# Patient Record
Sex: Male | Born: 1985 | Race: White | Hispanic: No | Marital: Single | Smoking: Current every day smoker
Health system: Southern US, Community
[De-identification: ages and names within clinical notes are randomized; demographics above are authoritative.]

## PROBLEM LIST (undated history)

## (undated) HISTORY — PX: HAND SURGERY: SHX662

## (undated) HISTORY — PX: OTHER SURGICAL HISTORY: SHX169

---
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
## 2012-06-21 ENCOUNTER — Encounter (HOSPITAL_COMMUNITY): Payer: Self-pay | Admitting: *Deleted

## 2012-06-21 ENCOUNTER — Inpatient Hospital Stay (HOSPITAL_COMMUNITY)
Admission: EM | Admit: 2012-06-21 | Discharge: 2012-06-24 | DRG: 908 | Disposition: A | Discharge status: Home / Self Care | Payer: MEDICAID | Attending: General Surgery | Admitting: General Surgery

## 2012-06-21 ENCOUNTER — Encounter (HOSPITAL_COMMUNITY): Payer: Self-pay | Admitting: Anesthesiology

## 2012-06-21 ENCOUNTER — Encounter (HOSPITAL_COMMUNITY): Admission: EM | Disposition: A | Discharge status: Home / Self Care | Payer: Self-pay | Source: Home / Self Care

## 2012-06-21 ENCOUNTER — Emergency Department (HOSPITAL_COMMUNITY): Payer: Self-pay

## 2012-06-21 ENCOUNTER — Emergency Department (HOSPITAL_COMMUNITY): Payer: Self-pay | Admitting: Anesthesiology

## 2012-06-21 DIAGNOSIS — S21109A Unspecified open wound of unspecified front wall of thorax without penetration into thoracic cavity, initial encounter: Secondary | ICD-10-CM

## 2012-06-21 DIAGNOSIS — F17200 Nicotine dependence, unspecified, uncomplicated: Secondary | ICD-10-CM | POA: Diagnosis present

## 2012-06-21 DIAGNOSIS — S4991XA Unspecified injury of right shoulder and upper arm, initial encounter: Secondary | ICD-10-CM | POA: Diagnosis present

## 2012-06-21 DIAGNOSIS — S41109A Unspecified open wound of unspecified upper arm, initial encounter: Secondary | ICD-10-CM

## 2012-06-21 DIAGNOSIS — W3400XA Accidental discharge from unspecified firearms or gun, initial encounter: Secondary | ICD-10-CM

## 2012-06-21 DIAGNOSIS — Z1810 Retained metal fragments, unspecified: Secondary | ICD-10-CM

## 2012-06-21 DIAGNOSIS — F411 Generalized anxiety disorder: Secondary | ICD-10-CM | POA: Diagnosis present

## 2012-06-21 DIAGNOSIS — F319 Bipolar disorder, unspecified: Secondary | ICD-10-CM | POA: Diagnosis present

## 2012-06-21 DIAGNOSIS — Z658 Other specified problems related to psychosocial circumstances: Secondary | ICD-10-CM

## 2012-06-21 DIAGNOSIS — Y230XXA Shotgun discharge, undetermined intent, initial encounter: Secondary | ICD-10-CM

## 2012-06-21 DIAGNOSIS — S41009A Unspecified open wound of unspecified shoulder, initial encounter: Principal | ICD-10-CM | POA: Diagnosis present

## 2012-06-21 DIAGNOSIS — D62 Acute posthemorrhagic anemia: Secondary | ICD-10-CM | POA: Diagnosis not present

## 2012-06-21 HISTORY — PX: I&D EXTREMITY: SHX5045

## 2012-06-21 LAB — COMPREHENSIVE METABOLIC PANEL
AST: 32 U/L (ref 0–37)
Albumin: 4.4 g/dL (ref 3.5–5.2)
Alkaline Phosphatase: 61 U/L (ref 39–117)
Chloride: 100 mEq/L (ref 96–112)
Creatinine, Ser: 1.18 mg/dL (ref 0.50–1.35)
Potassium: 3.4 mEq/L — ABNORMAL LOW (ref 3.5–5.1)
Total Bilirubin: 0.2 mg/dL — ABNORMAL LOW (ref 0.3–1.2)
Total Protein: 7.1 g/dL (ref 6.0–8.3)

## 2012-06-21 LAB — CBC
HCT: 42.5 % (ref 39.0–52.0)
MCHC: 35.1 g/dL (ref 30.0–36.0)
RBC: 4.68 MIL/uL (ref 4.22–5.81)
RDW: 12 % (ref 11.5–15.5)

## 2012-06-21 LAB — POCT I-STAT, CHEM 8
Calcium, Ion: 1.11 mmol/L — ABNORMAL LOW (ref 1.12–1.23)
HCT: 47 % (ref 39.0–52.0)
TCO2: 25 mmol/L (ref 0–100)

## 2012-06-21 LAB — TYPE AND SCREEN: Unit division: 0

## 2012-06-21 LAB — PROTIME-INR
INR: 1.05 (ref 0.00–1.49)
Prothrombin Time: 13.6 seconds (ref 11.6–15.2)

## 2012-06-21 SURGERY — IRRIGATION AND DEBRIDEMENT EXTREMITY
Anesthesia: General | Site: Arm Upper | Laterality: Right | Wound class: Contaminated

## 2012-06-21 MED ORDER — SODIUM CHLORIDE 0.9 % IV SOLN
INTRAVENOUS | Status: DC | PRN
  Administered 2012-06-21 (×2): via INTRAVENOUS

## 2012-06-21 MED ORDER — DEXAMETHASONE SODIUM PHOSPHATE 4 MG/ML IJ SOLN
INTRAMUSCULAR | Status: DC | PRN
  Administered 2012-06-21: 8 mg via INTRAVENOUS

## 2012-06-21 MED ORDER — FENTANYL CITRATE 0.05 MG/ML IJ SOLN
INTRAMUSCULAR | Status: DC | PRN
  Administered 2012-06-21 (×2): 50 ug via INTRAVENOUS
  Administered 2012-06-21: 150 ug via INTRAVENOUS

## 2012-06-21 MED ORDER — SODIUM CHLORIDE 0.9 % IR SOLN
Status: DC | PRN
  Administered 2012-06-21: 3000 mL
  Administered 2012-06-21: 1000 mL

## 2012-06-21 MED ORDER — TETANUS-DIPHTH-ACELL PERTUSSIS 5-2.5-18.5 LF-MCG/0.5 IM SUSP
0.5000 mL | Freq: Once | INTRAMUSCULAR | Status: AC
  Administered 2012-06-21: 0.5 mL via INTRAMUSCULAR
  Filled 2012-06-21: qty 0.5

## 2012-06-21 MED ORDER — MORPHINE SULFATE 4 MG/ML IJ SOLN: 4.0000 mg | Freq: Once | INTRAMUSCULAR | Status: DC

## 2012-06-21 MED ORDER — ONDANSETRON HCL 4 MG/2ML IJ SOLN
4.0000 mg | Freq: Once | INTRAMUSCULAR | Status: AC
  Administered 2012-06-21: 4 mg via INTRAVENOUS
  Filled 2012-06-21: qty 2

## 2012-06-21 MED ORDER — MORPHINE SULFATE 2 MG/ML IJ SOLN
INTRAMUSCULAR | Status: AC
  Administered 2012-06-21: 4 mg via INTRAVENOUS
  Filled 2012-06-21: qty 1

## 2012-06-21 MED ORDER — ONDANSETRON HCL 4 MG/2ML IJ SOLN: 4.0000 mg | Freq: Once | INTRAMUSCULAR | Status: DC

## 2012-06-21 MED ORDER — ARTIFICIAL TEARS OP OINT
TOPICAL_OINTMENT | OPHTHALMIC | Status: DC | PRN
  Administered 2012-06-21: 1 via OPHTHALMIC

## 2012-06-21 MED ORDER — MORPHINE SULFATE 2 MG/ML IJ SOLN
INTRAMUSCULAR | Status: AC
  Filled 2012-06-21: qty 2

## 2012-06-21 MED ORDER — PROPOFOL 10 MG/ML IV BOLUS
INTRAVENOUS | Status: DC | PRN
  Administered 2012-06-21: 200 mg via INTRAVENOUS

## 2012-06-21 MED ORDER — SUCCINYLCHOLINE CHLORIDE 20 MG/ML IJ SOLN
INTRAMUSCULAR | Status: DC | PRN
  Administered 2012-06-21: 120 mg via INTRAVENOUS

## 2012-06-21 MED ORDER — CEFAZOLIN SODIUM-DEXTROSE 2-3 GM-% IV SOLR
2.0000 g | Freq: Once | INTRAVENOUS | Status: AC
  Administered 2012-06-21: 2 g via INTRAVENOUS
  Filled 2012-06-21: qty 50

## 2012-06-21 MED ORDER — SODIUM CHLORIDE 0.9 % IR SOLN
Status: DC | PRN
  Administered 2012-06-21: 23:00:00

## 2012-06-21 MED ORDER — LIDOCAINE HCL (CARDIAC) 20 MG/ML IV SOLN
INTRAVENOUS | Status: DC | PRN
  Administered 2012-06-21: 100 mg via INTRAVENOUS

## 2012-06-21 MED ORDER — EPHEDRINE SULFATE 50 MG/ML IJ SOLN
INTRAMUSCULAR | Status: DC | PRN
  Administered 2012-06-21 (×2): 5 mg via INTRAVENOUS

## 2012-06-21 MED ORDER — ONDANSETRON HCL 4 MG/2ML IJ SOLN
INTRAMUSCULAR | Status: AC
  Administered 2012-06-21: 4 mg
  Filled 2012-06-21: qty 2

## 2012-06-21 SURGICAL SUPPLY — 57 items
BAG DECANTER FOR FLEXI CONT (MISCELLANEOUS) IMPLANT
BANDAGE ELASTIC 4 VELCRO ST LF (GAUZE/BANDAGES/DRESSINGS) ×3 IMPLANT
BANDAGE ELASTIC 6 VELCRO ST LF (GAUZE/BANDAGES/DRESSINGS) ×1 IMPLANT
BANDAGE ESMARK 6X9 LF (GAUZE/BANDAGES/DRESSINGS) IMPLANT
BANDAGE GAUZE ELAST BULKY 4 IN (GAUZE/BANDAGES/DRESSINGS) ×4 IMPLANT
BNDG CMPR 9X6 STRL LF SNTH (GAUZE/BANDAGES/DRESSINGS)
BNDG COHESIVE 4X5 TAN STRL (GAUZE/BANDAGES/DRESSINGS) ×2 IMPLANT
BNDG ESMARK 6X9 LF (GAUZE/BANDAGES/DRESSINGS)
CANISTER WOUND CARE 500ML ATS (WOUND CARE) ×1 IMPLANT
CLIP LIGATING EXTRA SM BLUE (MISCELLANEOUS) ×1 IMPLANT
CLOTH BEACON ORANGE TIMEOUT ST (SAFETY) ×2 IMPLANT
CUFF TOURNIQUET SINGLE 18IN (TOURNIQUET CUFF) ×2 IMPLANT
CUFF TOURNIQUET SINGLE 24IN (TOURNIQUET CUFF) IMPLANT
CUFF TOURNIQUET SINGLE 34IN LL (TOURNIQUET CUFF) IMPLANT
CUFF TOURNIQUET SINGLE 44IN (TOURNIQUET CUFF) IMPLANT
DRAPE U-SHAPE 47X51 STRL (DRAPES) ×2 IMPLANT
DRSG ADAPTIC 3X8 NADH LF (GAUZE/BANDAGES/DRESSINGS) ×1 IMPLANT
DRSG EMULSION OIL 3X3 NADH (GAUZE/BANDAGES/DRESSINGS) ×1 IMPLANT
DRSG PAD ABDOMINAL 8X10 ST (GAUZE/BANDAGES/DRESSINGS) ×3 IMPLANT
DURAPREP 26ML APPLICATOR (WOUND CARE) ×1 IMPLANT
ELECT CAUTERY BLADE 6.4 (BLADE) ×1 IMPLANT
ELECT REM PT RETURN 9FT ADLT (ELECTROSURGICAL) ×2
ELECTRODE REM PT RTRN 9FT ADLT (ELECTROSURGICAL) IMPLANT
GAUZE XEROFORM 1X8 LF (GAUZE/BANDAGES/DRESSINGS) ×1 IMPLANT
GLOVE BIO SURGEON STRL SZ8 (GLOVE) ×1 IMPLANT
GLOVE BIOGEL PI IND STRL 8 (GLOVE) ×2 IMPLANT
GLOVE BIOGEL PI INDICATOR 8 (GLOVE) ×3
GLOVE ECLIPSE 7.5 STRL STRAW (GLOVE) ×4 IMPLANT
GOWN PREVENTION PLUS XLARGE (GOWN DISPOSABLE) ×2 IMPLANT
GOWN SRG XL XLNG 56XLVL 4 (GOWN DISPOSABLE) ×1 IMPLANT
GOWN STRL NON-REIN LRG LVL3 (GOWN DISPOSABLE) ×2 IMPLANT
GOWN STRL NON-REIN XL XLG LVL4 (GOWN DISPOSABLE) ×2
GOWN STRL REIN 3XL LVL4 (GOWN DISPOSABLE) ×1 IMPLANT
HANDPIECE INTERPULSE COAX TIP (DISPOSABLE) ×2
KIT BASIN OR (CUSTOM PROCEDURE TRAY) ×2 IMPLANT
KIT ROOM TURNOVER OR (KITS) ×2 IMPLANT
MANIFOLD NEPTUNE II (INSTRUMENTS) ×2 IMPLANT
NS IRRIG 1000ML POUR BTL (IV SOLUTION) ×2 IMPLANT
PACK ORTHO EXTREMITY (CUSTOM PROCEDURE TRAY) ×2 IMPLANT
PAD ARMBOARD 7.5X6 YLW CONV (MISCELLANEOUS) ×4 IMPLANT
PAD CAST 4YDX4 CTTN HI CHSV (CAST SUPPLIES) ×1 IMPLANT
PADDING CAST COTTON 4X4 STRL (CAST SUPPLIES)
SET HNDPC FAN SPRY TIP SCT (DISPOSABLE) IMPLANT
SLING ULTRA II LARGE (SOFTGOODS) ×1 IMPLANT
SPLINT FIBERGLASS 4X30 (CAST SUPPLIES) ×1 IMPLANT
SPONGE GAUZE 4X4 12PLY (GAUZE/BANDAGES/DRESSINGS) ×1 IMPLANT
SPONGE LAP 18X18 X RAY DECT (DISPOSABLE) ×2 IMPLANT
STOCKINETTE IMPERVIOUS 9X36 MD (GAUZE/BANDAGES/DRESSINGS) ×2 IMPLANT
SUT SILK 2 0 (SUTURE) ×2
SUT SILK 2-0 18XBRD TIE 12 (SUTURE) IMPLANT
TOWEL OR 17X24 6PK STRL BLUE (TOWEL DISPOSABLE) ×2 IMPLANT
TOWEL OR 17X26 10 PK STRL BLUE (TOWEL DISPOSABLE) ×2 IMPLANT
TUBE ANAEROBIC SPECIMEN COL (MISCELLANEOUS) IMPLANT
TUBE CONNECTING 12X1/4 (SUCTIONS) ×2 IMPLANT
UNDERPAD 30X30 INCONTINENT (UNDERPADS AND DIAPERS) ×2 IMPLANT
WATER STERILE IRR 1000ML POUR (IV SOLUTION) ×2 IMPLANT
YANKAUER SUCT BULB TIP NO VENT (SUCTIONS) ×2 IMPLANT
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
## 2012-06-21 NOTE — ED Notes (Signed)
R Radial pulse strong.

## 2012-06-21 NOTE — ED Notes (Signed)
I stat reviewed

## 2012-06-21 NOTE — ED Notes (Signed)
Bleeding to R bicep controlled slowed, sterile saline gauze applied.
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
## 2012-06-21 NOTE — ED Notes (Signed)
Xray finished at BS 
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
## 2012-06-21 NOTE — ED Notes (Signed)
Jewelry removed and placed in specimen cup (2 necklace, 1 ring, 2 earings, 1 pentogram medallion).
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
## 2012-06-21 NOTE — Anesthesia Procedure Notes (Signed)
Procedure Name: Intubation Date/Time: 06/21/2012 11:01 PM Performed by: Wray Kearns A Pre-anesthesia Checklist: Patient identified, Timeout performed, Emergency Drugs available, Suction available and Patient being monitored Patient Re-evaluated:Patient Re-evaluated prior to inductionOxygen Delivery Method: Circle system utilized Preoxygenation: Pre-oxygenation with 100% oxygen Intubation Type: IV induction, Rapid sequence and Cricoid Pressure applied Laryngoscope Size: Mac and 4 Grade View: Grade I Tube type: Oral Tube size: 7.5 mm Number of attempts: 1 Placement Confirmation: ETT inserted through vocal cords under direct vision,  breath sounds checked- equal and bilateral and positive ETCO2 Secured at: 23 cm Tube secured with: Tape Dental Injury: Teeth and Oropharynx as per pre-operative assessment
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
## 2012-06-21 NOTE — Preoperative (Signed)
Beta Blockers   Reason not to administer Beta Blockers:Not Applicable 

## 2012-06-21 NOTE — ED Notes (Signed)
Belongings placed in paper bag
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
## 2012-06-21 NOTE — ED Notes (Signed)
Warm IVF NS hung

## 2012-06-22 ENCOUNTER — Encounter (HOSPITAL_COMMUNITY): Payer: Self-pay | Admitting: *Deleted

## 2012-06-22 LAB — HIV ANTIBODY (ROUTINE TESTING W REFLEX): HIV: NONREACTIVE

## 2012-06-22 LAB — ABO/RH: ABO/RH(D): A NEG

## 2012-06-22 MED ORDER — OXYCODONE HCL 5 MG PO TABS: 5.0000 mg | ORAL_TABLET | Freq: Once | ORAL | Status: DC | PRN

## 2012-06-22 MED ORDER — MIDAZOLAM HCL 2 MG/2ML IJ SOLN: 0.5000 mg | Freq: Once | INTRAMUSCULAR | Status: DC | PRN

## 2012-06-22 MED ORDER — MEPERIDINE HCL 25 MG/ML IJ SOLN: 6.2500 mg | INTRAMUSCULAR | Status: DC | PRN

## 2012-06-22 MED ORDER — SODIUM CHLORIDE 0.9 % IV SOLN
INTRAVENOUS | Status: DC
  Administered 2012-06-22 – 2012-06-23 (×3): via INTRAVENOUS

## 2012-06-22 MED ORDER — LORAZEPAM BOLUS VIA INFUSION
1.0000 mg | INTRAVENOUS | Status: DC | PRN
  Filled 2012-06-22: qty 1

## 2012-06-22 MED ORDER — NICOTINE 21 MG/24HR TD PT24
21.0000 mg | MEDICATED_PATCH | Freq: Every day | TRANSDERMAL | Status: DC
Start: 2012-06-22 — End: 2012-06-22
  Filled 2012-06-22: qty 1

## 2012-06-22 MED ORDER — OXYCODONE HCL 5 MG/5ML PO SOLN: 5.0000 mg | Freq: Once | ORAL | Status: DC | PRN

## 2012-06-22 MED ORDER — ONDANSETRON HCL 4 MG/2ML IJ SOLN: 4.0000 mg | Freq: Four times a day (QID) | INTRAMUSCULAR | Status: DC | PRN

## 2012-06-22 MED ORDER — BUPROPION HCL 75 MG PO TABS
75.0000 mg | ORAL_TABLET | Freq: Two times a day (BID) | ORAL | Status: DC
  Administered 2012-06-22 – 2012-06-23 (×3): 75 mg via ORAL
  Filled 2012-06-22 (×6): qty 1

## 2012-06-22 MED ORDER — HYDROMORPHONE HCL PF 1 MG/ML IJ SOLN
INTRAMUSCULAR | Status: AC
  Filled 2012-06-22: qty 1

## 2012-06-22 MED ORDER — METHOCARBAMOL 100 MG/ML IJ SOLN
500.0000 mg | Freq: Four times a day (QID) | INTRAVENOUS | Status: DC | PRN
  Administered 2012-06-22: 500 mg via INTRAVENOUS
  Filled 2012-06-22: qty 5

## 2012-06-22 MED ORDER — METHOCARBAMOL 500 MG PO TABS
500.0000 mg | ORAL_TABLET | Freq: Four times a day (QID) | ORAL | Status: DC | PRN
  Administered 2012-06-22: 500 mg via ORAL
  Filled 2012-06-22 (×2): qty 1

## 2012-06-22 MED ORDER — PROMETHAZINE HCL 25 MG/ML IJ SOLN: 6.2500 mg | INTRAMUSCULAR | Status: DC | PRN

## 2012-06-22 MED ORDER — ONDANSETRON HCL 4 MG PO TABS: 4.0000 mg | ORAL_TABLET | Freq: Four times a day (QID) | ORAL | Status: DC | PRN

## 2012-06-22 MED ORDER — OXYCODONE-ACETAMINOPHEN 5-325 MG PO TABS
1.0000 | ORAL_TABLET | ORAL | Status: DC | PRN
  Administered 2012-06-22 – 2012-06-24 (×6): 2 via ORAL
  Filled 2012-06-22 (×6): qty 2

## 2012-06-22 MED ORDER — ZOLPIDEM TARTRATE 5 MG PO TABS: 5.0000 mg | ORAL_TABLET | Freq: Every evening | ORAL | Status: DC | PRN

## 2012-06-22 MED ORDER — HYDROMORPHONE HCL PF 1 MG/ML IJ SOLN
0.2500 mg | INTRAMUSCULAR | Status: DC | PRN
  Administered 2012-06-22 (×4): 0.5 mg via INTRAVENOUS

## 2012-06-22 MED ORDER — HYDROXYZINE PAMOATE 25 MG PO CAPS: 25.0000 mg | ORAL_CAPSULE | Freq: Three times a day (TID) | ORAL | Status: DC | PRN

## 2012-06-22 MED ORDER — HYDROMORPHONE HCL PF 1 MG/ML IJ SOLN
1.0000 mg | INTRAMUSCULAR | Status: DC | PRN
  Administered 2012-06-22 (×2): 1 mg via INTRAVENOUS
  Administered 2012-06-23 – 2012-06-24 (×5): 2 mg via INTRAVENOUS
  Filled 2012-06-22: qty 2
  Filled 2012-06-22: qty 1
  Filled 2012-06-22: qty 2
  Filled 2012-06-22: qty 1
  Filled 2012-06-22 (×3): qty 2

## 2012-06-22 MED ORDER — CEFAZOLIN SODIUM-DEXTROSE 2-3 GM-% IV SOLR
2.0000 g | Freq: Three times a day (TID) | INTRAVENOUS | Status: DC
  Administered 2012-06-22 – 2012-06-24 (×6): 2 g via INTRAVENOUS
  Filled 2012-06-22 (×9): qty 50
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
## 2012-06-22 NOTE — Transfer of Care (Signed)
Immediate Anesthesia Transfer of Care Note  Patient: Grant Cooper  Procedure(s) Performed: Procedure(s) (LRB) with comments: IRRIGATION AND DEBRIDEMENT EXTREMITY (Right)  Patient Location: PACU  Anesthesia Type:General  Level of Consciousness: oriented, sedated, patient cooperative and responds to stimulation  Airway & Oxygen Therapy: Patient Spontanous Breathing and Patient connected to face mask oxygen  Post-op Assessment: Report given to PACU RN, Post -op Vital signs reviewed and stable, Patient moving all extremities and Patient moving all extremities X 4  Post vital signs: Reviewed and stable  Complications: No apparent anesthesia complications
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
## 2012-06-22 NOTE — Anesthesia Postprocedure Evaluation (Signed)
  Anesthesia Post-op Note  Patient: Grant Cooper  Procedure(s) Performed: Procedure(s) (LRB) with comments: IRRIGATION AND DEBRIDEMENT EXTREMITY (Right)  Patient Location: PACU  Anesthesia Type:General  Level of Consciousness: awake, alert , oriented and patient cooperative  Airway and Oxygen Therapy: Patient Spontanous Breathing and Patient connected to nasal cannula oxygen  Post-op Pain: mild  Post-op Assessment: Post-op Vital signs reviewed, Patient's Cardiovascular Status Stable, Respiratory Function Stable, Patent Airway, No signs of Nausea or vomiting and Pain level controlled  Post-op Vital Signs: Reviewed and stable  Complications: No apparent anesthesia complications

## 2012-06-22 NOTE — Progress Notes (Signed)
UR completed.   Pt noted to need NPWT at d/c. He has no insurance coverage so would have to use [facility]'s system rather than a [facility] VAC.  Await final surgeries for MD to determine exactly what pt will need at d/c.
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
## 2012-06-22 NOTE — ED Provider Notes (Signed)
I have personally seen and examined the patient.  I have discussed the plan of care with the resident.  I have reviewed the documentation on PMH/FH/Soc. History.  I have reviewed the documentation of the resident and agree. CRITICAL CARE Performed by: Doug Sou   Total critical care time: 30 minute  Critical care time was exclusive of separately billable procedures and treating other patients.  Critical care was necessary to treat or prevent imminent or life-threatening deterioration.  Critical care was time spent personally by me on the following activities: development of treatment plan with patient and/or surrogate as well as nursing, discussions with consultants, evaluation of patient's response to treatment, examination of patient, obtaining history from patient or surrogate, ordering and performing treatments and interventions, ordering and review of laboratory studies, ordering and review of radiographic studies, pulse oximetry and re-evaluation of patient's condition.  Doug Sou, MD 06/22/12 [phone]
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
## 2012-06-23 ENCOUNTER — Encounter (HOSPITAL_COMMUNITY): Payer: Self-pay | Admitting: Certified Registered"

## 2012-06-23 ENCOUNTER — Encounter (HOSPITAL_COMMUNITY): Admission: EM | Disposition: A | Discharge status: Home / Self Care | Payer: Self-pay | Source: Home / Self Care

## 2012-06-23 ENCOUNTER — Inpatient Hospital Stay (HOSPITAL_COMMUNITY): Payer: MEDICAID | Admitting: Certified Registered"

## 2012-06-23 HISTORY — PX: I&D EXTREMITY: SHX5045

## 2012-06-23 LAB — BASIC METABOLIC PANEL
BUN: 11 mg/dL (ref 6–23)
Calcium: 8.6 mg/dL (ref 8.4–10.5)
Creatinine, Ser: 1.05 mg/dL (ref 0.50–1.35)
GFR calc Af Amer: >90 mL/min (ref >90)
GFR calc non Af Amer: >90 mL/min (ref >90)

## 2012-06-23 LAB — CBC
HCT: 32.3 % — ABNORMAL LOW (ref 39.0–52.0)
MCH: 32.5 pg (ref 26.0–34.0)
MCHC: 35.3 g/dL (ref 30.0–36.0)
MCV: 92 fL (ref 78.0–100.0)
RDW: 12.4 % (ref 11.5–15.5)

## 2012-06-23 SURGERY — IRRIGATION AND DEBRIDEMENT EXTREMITY
Anesthesia: General | Site: Arm Upper | Laterality: Right | Wound class: Dirty or Infected

## 2012-06-23 MED ORDER — PHENOL 1.4 % MT LIQD: 1.0000 | OROMUCOSAL | Status: DC | PRN

## 2012-06-23 MED ORDER — ACETAMINOPHEN 650 MG RE SUPP: 650.0000 mg | Freq: Four times a day (QID) | RECTAL | Status: DC | PRN

## 2012-06-23 MED ORDER — METHOCARBAMOL 100 MG/ML IJ SOLN
500.0000 mg | Freq: Four times a day (QID) | INTRAVENOUS | Status: DC | PRN
  Filled 2012-06-23: qty 5

## 2012-06-23 MED ORDER — METOCLOPRAMIDE HCL 5 MG/ML IJ SOLN: 5.0000 mg | Freq: Three times a day (TID) | INTRAMUSCULAR | Status: DC | PRN

## 2012-06-23 MED ORDER — OXYCODONE HCL 5 MG PO TABS: 5.0000 mg | ORAL_TABLET | Freq: Once | ORAL | Status: AC | PRN

## 2012-06-23 MED ORDER — ALUM & MAG HYDROXIDE-SIMETH 200-200-20 MG/5ML PO SUSP: 30.0000 mL | ORAL | Status: DC | PRN

## 2012-06-23 MED ORDER — PROMETHAZINE HCL 25 MG/ML IJ SOLN: 6.2500 mg | INTRAMUSCULAR | Status: DC | PRN

## 2012-06-23 MED ORDER — KETOROLAC TROMETHAMINE 30 MG/ML IJ SOLN
30.0000 mg | Freq: Four times a day (QID) | INTRAMUSCULAR | Status: DC
  Administered 2012-06-23 – 2012-06-24 (×3): 30 mg via INTRAVENOUS
  Filled 2012-06-23 (×7): qty 1

## 2012-06-23 MED ORDER — HYDROMORPHONE HCL PF 1 MG/ML IJ SOLN
0.2500 mg | INTRAMUSCULAR | Status: DC | PRN
  Administered 2012-06-23: 0.5 mg via INTRAVENOUS

## 2012-06-23 MED ORDER — FENTANYL CITRATE 0.05 MG/ML IJ SOLN
INTRAMUSCULAR | Status: DC | PRN
  Administered 2012-06-23: 100 ug via INTRAVENOUS
  Administered 2012-06-23: 50 ug via INTRAVENOUS

## 2012-06-23 MED ORDER — MIDAZOLAM HCL 2 MG/2ML IJ SOLN: 0.5000 mg | Freq: Once | INTRAMUSCULAR | Status: AC | PRN

## 2012-06-23 MED ORDER — SODIUM CHLORIDE 0.9 % IR SOLN
Status: DC | PRN
  Administered 2012-06-23: 1000 mL

## 2012-06-23 MED ORDER — ONDANSETRON HCL 4 MG/2ML IJ SOLN
INTRAMUSCULAR | Status: DC | PRN
  Administered 2012-06-23: 4 mg via INTRAVENOUS

## 2012-06-23 MED ORDER — METOCLOPRAMIDE HCL 10 MG PO TABS: 5.0000 mg | ORAL_TABLET | Freq: Three times a day (TID) | ORAL | Status: DC | PRN

## 2012-06-23 MED ORDER — ONDANSETRON HCL 4 MG PO TABS: 4.0000 mg | ORAL_TABLET | Freq: Four times a day (QID) | ORAL | Status: DC | PRN

## 2012-06-23 MED ORDER — LIDOCAINE HCL (CARDIAC) 20 MG/ML IV SOLN
INTRAVENOUS | Status: DC | PRN
  Administered 2012-06-23: 20 mg via INTRAVENOUS

## 2012-06-23 MED ORDER — OXYCODONE HCL 5 MG/5ML PO SOLN: 5.0000 mg | Freq: Once | ORAL | Status: AC | PRN

## 2012-06-23 MED ORDER — PROPOFOL 10 MG/ML IV BOLUS
INTRAVENOUS | Status: DC | PRN
  Administered 2012-06-23: 200 mg via INTRAVENOUS

## 2012-06-23 MED ORDER — HYDROMORPHONE HCL PF 1 MG/ML IJ SOLN
INTRAMUSCULAR | Status: AC
  Filled 2012-06-23: qty 1

## 2012-06-23 MED ORDER — MEPERIDINE HCL 25 MG/ML IJ SOLN: 6.2500 mg | INTRAMUSCULAR | Status: DC | PRN

## 2012-06-23 MED ORDER — OXYCODONE HCL 5 MG PO TABS
5.0000 mg | ORAL_TABLET | ORAL | Status: DC | PRN
  Administered 2012-06-23: 10 mg via ORAL
  Filled 2012-06-23: qty 2

## 2012-06-23 MED ORDER — ONDANSETRON HCL 4 MG/2ML IJ SOLN: 4.0000 mg | Freq: Four times a day (QID) | INTRAMUSCULAR | Status: DC | PRN

## 2012-06-23 MED ORDER — LACTATED RINGERS IV SOLN
INTRAVENOUS | Status: DC | PRN
  Administered 2012-06-23: 11:00:00 via INTRAVENOUS

## 2012-06-23 MED ORDER — METHOCARBAMOL 500 MG PO TABS
500.0000 mg | ORAL_TABLET | Freq: Four times a day (QID) | ORAL | Status: DC | PRN
  Administered 2012-06-23 – 2012-06-24 (×3): 1000 mg via ORAL
  Filled 2012-06-23 (×3): qty 2

## 2012-06-23 MED ORDER — MENTHOL 3 MG MT LOZG: 1.0000 | LOZENGE | OROMUCOSAL | Status: DC | PRN

## 2012-06-23 MED ORDER — ACETAMINOPHEN 325 MG PO TABS: 650.0000 mg | ORAL_TABLET | Freq: Four times a day (QID) | ORAL | Status: DC | PRN

## 2012-06-23 SURGICAL SUPPLY — 50 items
BANDAGE ELASTIC 3 VELCRO ST LF (GAUZE/BANDAGES/DRESSINGS) ×1 IMPLANT
BANDAGE ELASTIC 4 VELCRO ST LF (GAUZE/BANDAGES/DRESSINGS) ×1 IMPLANT
BANDAGE GAUZE ELAST BULKY 4 IN (GAUZE/BANDAGES/DRESSINGS) ×3 IMPLANT
BLADE SURG 10 STRL SS (BLADE) ×1 IMPLANT
BNDG COHESIVE 4X5 TAN STRL (GAUZE/BANDAGES/DRESSINGS) ×2 IMPLANT
BNDG GAUZE STRTCH 6 (GAUZE/BANDAGES/DRESSINGS) ×3 IMPLANT
BRUSH SCRUB DISP (MISCELLANEOUS) ×4 IMPLANT
CLOTH BEACON ORANGE TIMEOUT ST (SAFETY) ×2 IMPLANT
COVER SURGICAL LIGHT HANDLE (MISCELLANEOUS) ×3 IMPLANT
DRAPE C-ARMOR (DRAPES) ×1 IMPLANT
DRAPE INCISE IOBAN 66X45 STRL (DRAPES) ×1 IMPLANT
DRAPE U-SHAPE 47X51 STRL (DRAPES) ×2 IMPLANT
DRSG ADAPTIC 3X8 NADH LF (GAUZE/BANDAGES/DRESSINGS) ×1 IMPLANT
DRSG MEPITEL 4X7.2 (GAUZE/BANDAGES/DRESSINGS) ×1 IMPLANT
DRSG PAD ABDOMINAL 8X10 ST (GAUZE/BANDAGES/DRESSINGS) ×1 IMPLANT
ELECT CAUTERY BLADE 6.4 (BLADE) IMPLANT
ELECT REM PT RETURN 9FT ADLT (ELECTROSURGICAL) ×2
ELECTRODE REM PT RTRN 9FT ADLT (ELECTROSURGICAL) IMPLANT
GLOVE BIO SURGEON STRL SZ7.5 (GLOVE) ×2 IMPLANT
GLOVE BIO SURGEON STRL SZ8 (GLOVE) ×2 IMPLANT
GLOVE BIOGEL PI IND STRL 7.5 (GLOVE) ×1 IMPLANT
GLOVE BIOGEL PI IND STRL 8 (GLOVE) ×1 IMPLANT
GLOVE BIOGEL PI INDICATOR 7.5 (GLOVE) ×1
GLOVE BIOGEL PI INDICATOR 8 (GLOVE) ×1
GLOVE NEODERM STER SZ 7 (GLOVE) ×1 IMPLANT
GOWN PREVENTION PLUS XLARGE (GOWN DISPOSABLE) ×2 IMPLANT
GOWN STRL NON-REIN LRG LVL3 (GOWN DISPOSABLE) ×4 IMPLANT
HANDPIECE INTERPULSE COAX TIP (DISPOSABLE)
KIT BASIN OR (CUSTOM PROCEDURE TRAY) ×2 IMPLANT
KIT ROOM TURNOVER OR (KITS) ×2 IMPLANT
MANIFOLD NEPTUNE II (INSTRUMENTS) ×2 IMPLANT
NS IRRIG 1000ML POUR BTL (IV SOLUTION) ×2 IMPLANT
PACK ORTHO EXTREMITY (CUSTOM PROCEDURE TRAY) ×2 IMPLANT
PAD ARMBOARD 7.5X6 YLW CONV (MISCELLANEOUS) ×4 IMPLANT
PADDING CAST COTTON 6X4 STRL (CAST SUPPLIES) ×1 IMPLANT
SET HNDPC FAN SPRY TIP SCT (DISPOSABLE) IMPLANT
SPONGE GAUZE 4X4 12PLY (GAUZE/BANDAGES/DRESSINGS) ×3 IMPLANT
SPONGE LAP 18X18 X RAY DECT (DISPOSABLE) ×2 IMPLANT
STOCKINETTE IMPERVIOUS 9X36 MD (GAUZE/BANDAGES/DRESSINGS) ×2 IMPLANT
SUT ETHILON 2 0 FS 18 (SUTURE) ×1 IMPLANT
SUT ETHILON O TP 1 (SUTURE) ×1 IMPLANT
SUT PDS AB 0 CT 36 (SUTURE) ×1 IMPLANT
SUT PDS AB 2-0 CT1 27 (SUTURE) ×1 IMPLANT
TOWEL OR 17X24 6PK STRL BLUE (TOWEL DISPOSABLE) ×2 IMPLANT
TOWEL OR 17X26 10 PK STRL BLUE (TOWEL DISPOSABLE) ×3 IMPLANT
TUBE ANAEROBIC SPECIMEN COL (MISCELLANEOUS) IMPLANT
TUBE CONNECTING 12X1/4 (SUCTIONS) ×2 IMPLANT
UNDERPAD 30X30 INCONTINENT (UNDERPADS AND DIAPERS) ×2 IMPLANT
WATER STERILE IRR 1000ML POUR (IV SOLUTION) ×1 IMPLANT
YANKAUER SUCT BULB TIP NO VENT (SUCTIONS) ×2 IMPLANT
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
## 2012-06-23 NOTE — Anesthesia Procedure Notes (Signed)
Procedure Name: LMA Insertion Date/Time: 06/23/2012 10:56 AM Performed by: Arlice Colt B Pre-anesthesia Checklist: Patient identified, Emergency Drugs available, Suction available, Patient being monitored and Timeout performed Patient Re-evaluated:Patient Re-evaluated prior to inductionOxygen Delivery Method: Circle system utilized Preoxygenation: Pre-oxygenation with 100% oxygen Intubation Type: IV induction LMA: LMA inserted LMA Size: 4.0 Number of attempts: 1 Placement Confirmation: positive ETCO2 and breath sounds checked- equal and bilateral Tube secured with: Tape Dental Injury: Teeth and Oropharynx as per pre-operative assessment
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
## 2012-06-23 NOTE — Anesthesia Preprocedure Evaluation (Signed)
Anesthesia Evaluation  Patient identified by MRN, date of birth, ID band Patient awake    Reviewed: Allergy & Precautions, H&P , NPO status , Patient's Chart, lab work & pertinent test results  History of Anesthesia Complications Negative for: history of anesthetic complications  Airway Mallampati: I TM Distance: >3 FB Neck ROM: Full    Dental  (+) Chipped and Dental Advisory Given   Pulmonary Current Smoker,  breath sounds clear to auscultation        Cardiovascular negative cardio ROS  Rhythm:Regular Rate:Normal     Neuro/Psych negative neurological ROS  negative psych ROS   GI/Hepatic negative GI ROS, Neg liver ROS,   Endo/Other  negative endocrine ROS  Renal/GU negative Renal ROS     Musculoskeletal   Abdominal   Peds  Hematology   Anesthesia Other Findings   Reproductive/Obstetrics                           Anesthesia Physical Anesthesia Plan  ASA: II  Anesthesia Plan: General   Post-op Pain Management:    Induction: Intravenous  Airway Management Planned: LMA  Additional Equipment:   Intra-op Plan:   Post-operative Plan:   Informed Consent: I have reviewed the patients History and Physical, chart, labs and discussed the procedure including the risks, benefits and alternatives for the proposed anesthesia with the patient or authorized representative who has indicated his/her understanding and acceptance.   Dental advisory given  Plan Discussed with: CRNA and Surgeon  Anesthesia Plan Comments: (Plan routine monitors, GA- LMA OK)        Anesthesia Quick Evaluation

## 2012-06-23 NOTE — Consult Note (Signed)
I have seen and examined the patient. I agree with the findings above.  I have reviewed the history and operative findings with Dr. Luiz Blare, as well.  PE: Right upper extremity  Tingling along the thumb index and middle fingers  Reports decreased sensation along the radial and median nerves  Ulnar nerve sensation is intact  Radial, ulnar, median, PIN, AIN, axillary motor function are intact  Brisk capillary refill  Strong 2+ radial pulse  No pain with passive stretching of the digits or wrist  Plan for repeat debridement today, possible closure vs repeat vac and closure on Thursday.  Budd Palmer, MD 06/23/2012 10:13 AM

## 2012-06-23 NOTE — Transfer of Care (Signed)
Immediate Anesthesia Transfer of Care Note  Patient: Grant Cooper  Procedure(s) Performed: Procedure(s) (LRB) with comments: IRRIGATION AND DEBRIDEMENT EXTREMITY (Right) - right upper arm I&D  Patient Location: PACU  Anesthesia Type:General  Level of Consciousness: awake, alert  and oriented  Airway & Oxygen Therapy: Patient Spontanous Breathing and Patient connected to nasal cannula oxygen  Post-op Assessment: Report given to PACU RN and Post -op Vital signs reviewed and stable  Post vital signs: Reviewed and stable  Complications: No apparent anesthesia complications

## 2012-06-23 NOTE — Anesthesia Postprocedure Evaluation (Signed)
  Anesthesia Post-op Note  Patient: Grant Cooper  Procedure(s) Performed: Procedure(s) (LRB) with comments: IRRIGATION AND DEBRIDEMENT EXTREMITY (Right) - right upper arm I&D  Patient Location: PACU  Anesthesia Type:General  Level of Consciousness: awake, alert , oriented and patient cooperative  Airway and Oxygen Therapy: Patient Spontanous Breathing  Post-op Pain: mild  Post-op Assessment: Post-op Vital signs reviewed, Patient's Cardiovascular Status Stable, Respiratory Function Stable, Patent Airway, No signs of Nausea or vomiting and Pain level controlled  Post-op Vital Signs: Reviewed and stable  Complications: No apparent anesthesia complications

## 2012-06-24 ENCOUNTER — Encounter (HOSPITAL_COMMUNITY): Payer: Self-pay | Admitting: Orthopedic Surgery

## 2012-06-24 DIAGNOSIS — D62 Acute posthemorrhagic anemia: Secondary | ICD-10-CM | POA: Diagnosis not present

## 2012-06-24 DIAGNOSIS — S4991XA Unspecified injury of right shoulder and upper arm, initial encounter: Secondary | ICD-10-CM | POA: Diagnosis present

## 2012-06-24 DIAGNOSIS — F329 Major depressive disorder, single episode, unspecified: Secondary | ICD-10-CM | POA: Insufficient documentation

## 2012-06-24 LAB — CBC
HCT: 29.1 % — ABNORMAL LOW (ref 39.0–52.0)
MCH: 31.8 pg (ref 26.0–34.0)
MCV: 91.5 fL (ref 78.0–100.0)
Platelets: 176 10*3/uL (ref 150–400)
RDW: 11.9 % (ref 11.5–15.5)

## 2012-06-24 LAB — BASIC METABOLIC PANEL
BUN: 12 mg/dL (ref 6–23)
Calcium: 8.7 mg/dL (ref 8.4–10.5)
Creatinine, Ser: 1.09 mg/dL (ref 0.50–1.35)
GFR calc Af Amer: >90 mL/min (ref >90)

## 2012-06-24 MED ORDER — OXYCODONE-ACETAMINOPHEN 10-325 MG PO TABS: 1.0000 | ORAL_TABLET | Freq: Four times a day (QID) | ORAL | Status: AC | PRN

## 2012-06-24 MED ORDER — OXYCODONE HCL 5 MG PO TABS
10.0000 mg | ORAL_TABLET | ORAL | Status: DC | PRN
  Administered 2012-06-24 (×2): 20 mg via ORAL
  Filled 2012-06-24 (×2): qty 4

## 2012-06-24 MED ORDER — METHOCARBAMOL 500 MG PO TABS: 500.0000 mg | ORAL_TABLET | Freq: Four times a day (QID) | ORAL | Status: DC | PRN

## 2012-06-24 MED ORDER — BUPROPION HCL ER (SR) 100 MG PO TB12
100.0000 mg | ORAL_TABLET | Freq: Two times a day (BID) | ORAL | Status: DC
Start: 2012-06-24 — End: 2012-06-24
  Filled 2012-06-24 (×2): qty 1

## 2012-06-24 MED ORDER — HYDROXYZINE HCL 25 MG PO TABS: 25.0000 mg | ORAL_TABLET | Freq: Two times a day (BID) | ORAL | Status: DC

## 2012-06-24 MED ORDER — HYDROMORPHONE HCL PF 1 MG/ML IJ SOLN
1.0000 mg | INTRAMUSCULAR | Status: DC | PRN
  Filled 2012-06-24: qty 1
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
## 2012-06-24 NOTE — Progress Notes (Signed)
Patient should be able to go home if okay with Orthopedics.  Follow up with them only.  This patient has been seen and I agree with the findings and treatment plan.  Marta Lamas. Gae Bon, MD, FACS [phone] (pager) [phone] (direct pager) Trauma Surgeon

## 2012-06-24 NOTE — Progress Notes (Signed)
Pt unable to afford medications at d/c. Qualified for [facility] program.  Enrollment completed and PA given letter to place with Rx when printed at d/c. CSW has instructed pt on going to outpt pharmacy to fill Rx.
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
## 2012-06-24 NOTE — Progress Notes (Signed)
Occupational Therapy Treatment Patient Details Name: JOSEDANIEL HAYE MRN: [id number] DOB: [birth year] Today's Date: 06/24/2012 Time: 0865-7846 OT Time Calculation (min): 29 min  OT Assessment / Plan / Recommendation Comments on Treatment Session Completed all education with pt/girlfriend. Pt tolerating ROM elbow 30 - 90 without difficulty. No further OT needs. Pt c/o numbness in 4th and 5th fingers and sensitivity in palm consistent with ulnar n distribution. nsg made aware    Follow Up Recommendations  No OT follow up . Pt to f/u with MD. Should not need further therapy.   Barriers to Discharge   none    Equipment Recommendations  None recommended by OT    Recommendations for Other Services  none  Frequency Min 2X/week   Plan Discharge plan remains appropriate    Precautions / Restrictions Precautions Precautions: Other (comment) Required Braces or Orthoses: Other Brace/Splint Other Brace/Splint: sling for comfort Restrictions Weight Bearing Restrictions:  (no pushing pulling lifting  RUE)   Pertinent Vitals/Pain Requested pain meds. nsg aware    ADL  ADL Comments: Pt/girlfriend able to verbalize techniques for ADL and verbalize precautions. PT able to verbalize need to keep RUE elevated and use of ice for edema.    OT Diagnosis:    OT Problem List:   OT Treatment Interventions:     OT Goals Acute Rehab OT Goals OT Goal Formulation: With patient Time For Goal Achievement: 06/30/12 Potential to Achieve Goals: Good ADL Goals Pt Will Perform Upper Body Bathing: with supervision;with caregiver independent in assisting ADL Goal: Upper Body Bathing - Progress: Met Pt Will Perform Upper Body Dressing: with supervision;with caregiver independent in assisting ADL Goal: Upper Body Dressing - Progress: Met Additional ADL Goal #1: independent with donning/doffing sling ADL Goal: Additional Goal #1 - Progress: Met Arm Goals Pt Will Perform AROM: Independently;Right upper  extremity;1 set;Other (comment) Arm Goal: AROM - Progress: Met Additional Arm Goal #1: pt will verbalize precautions and importance of keeping RUE elevated Arm Goal: Additional Goal #1 - Progress: Met  Visit Information  Last OT Received On: 06/24/12 Assistance Needed: +1    Subjective Data      Prior Functioning       Cognition  Overall Cognitive Status: Appears within functional limits for tasks assessed/performed Arousal/Alertness: Awake/alert Orientation Level: Appears intact for tasks assessed Behavior During Session: [facility] for tasks performed    Mobility  Shoulder Instructions      ROM as tolerated   Exercises  General Exercises - Upper Extremity Shoulder Flexion: AAROM;Right;5 reps;Seated Elbow Flexion: AAROM;Other (comment) Elbow Extension: AAROM;Right;Other (comment) Wrist Flexion: AROM;Left;10 reps Wrist Extension: AROM;Right;10 reps Digit Composite Flexion: AROM;Right;10 reps Composite Extension: AROM;Right;10 reps Hand Exercises Forearm Supination: AROM;5 reps;Right Forearm Pronation: AROM;Right;5 reps   Balance     End of Session OT - End of Session Activity Tolerance: Patient tolerated treatment well Patient left: in bed;with call bell/phone within reach;with family/visitor present Nurse Communication: Mobility status;Weight bearing status;Precautions  GO     Jnyah Brazee,HILLARY 06/24/2012, 10:48 AM Luisa Dago, OTR/L  [phone] 06/24/2012
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
## 2012-06-25 NOTE — Progress Notes (Signed)
Discharge instructions reviewed with patient.  Rx's x 2 given, along with necessary paperwork and directions to the [employer].  Patient was set up through SW for assistance with Meds.  Patient verbalized understanding of discharge instructions and follow up appointment.  All questions answered.  All belongings were with patient at discharge.  Patient d/c in stable condition and walked out with family.
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
## 2012-09-16 ENCOUNTER — Encounter (HOSPITAL_COMMUNITY): Payer: Self-pay | Admitting: Emergency Medicine

## 2012-09-16 ENCOUNTER — Emergency Department (HOSPITAL_COMMUNITY)
Admission: EM | Admit: 2012-09-16 | Discharge: 2012-09-16 | Disposition: A | Discharge status: Home / Self Care | Payer: Self-pay | Attending: Emergency Medicine | Admitting: Emergency Medicine

## 2012-09-16 DIAGNOSIS — F1010 Alcohol abuse, uncomplicated: Secondary | ICD-10-CM | POA: Insufficient documentation

## 2012-09-16 DIAGNOSIS — T48204A Poisoning by unspecified drugs acting on muscles, undetermined, initial encounter: Secondary | ICD-10-CM | POA: Insufficient documentation

## 2012-09-16 DIAGNOSIS — T48201A Poisoning by unspecified drugs acting on muscles, accidental (unintentional), initial encounter: Secondary | ICD-10-CM | POA: Insufficient documentation

## 2012-09-16 DIAGNOSIS — Y939 Activity, unspecified: Secondary | ICD-10-CM | POA: Insufficient documentation

## 2012-09-16 DIAGNOSIS — Y9289 Other specified places as the place of occurrence of the external cause: Secondary | ICD-10-CM | POA: Insufficient documentation

## 2012-09-16 DIAGNOSIS — F17200 Nicotine dependence, unspecified, uncomplicated: Secondary | ICD-10-CM | POA: Insufficient documentation

## 2012-09-16 DIAGNOSIS — Z79899 Other long term (current) drug therapy: Secondary | ICD-10-CM | POA: Insufficient documentation

## 2012-09-16 LAB — BASIC METABOLIC PANEL
BUN: 13 mg/dL (ref 6–23)
CO2: 27 mEq/L (ref 19–32)
Calcium: 9.5 mg/dL (ref 8.4–10.5)
Creatinine, Ser: 1.19 mg/dL (ref 0.50–1.35)
GFR calc non Af Amer: 84 mL/min — ABNORMAL LOW (ref >90)
Glucose, Bld: 89 mg/dL (ref 70–99)
Sodium: 140 mEq/L (ref 135–145)

## 2012-09-16 LAB — SALICYLATE LEVEL: Salicylate Lvl: <2 mg/dL — ABNORMAL LOW (ref 2.8–20.0)

## 2012-09-16 LAB — CBC WITH DIFFERENTIAL/PLATELET
Eosinophils Absolute: 0.1 10*3/uL (ref 0.0–0.7)
Eosinophils Relative: 1 % (ref 0–5)
HCT: 42.7 % (ref 39.0–52.0)
Lymphocytes Relative: 24 % (ref 12–46)
Lymphs Abs: 2 10*3/uL (ref 0.7–4.0)
MCH: 30.9 pg (ref 26.0–34.0)
MCV: 88 fL (ref 78.0–100.0)
Monocytes Absolute: 0.8 10*3/uL (ref 0.1–1.0)
Monocytes Relative: 9 % (ref 3–12)
Platelets: 224 10*3/uL (ref 150–400)
RBC: 4.85 MIL/uL (ref 4.22–5.81)

## 2012-09-16 MED ORDER — SODIUM CHLORIDE 0.9 % IV SOLN
INTRAVENOUS | Status: DC
  Administered 2012-09-16: 22:00:00 via INTRAVENOUS
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
## 2012-09-16 NOTE — ED Notes (Signed)
Per pt's girlfriend pt has been drinking tonight and took unknown amount of flexeril.
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
## 2012-09-16 NOTE — ED Notes (Signed)
Pt arousable, responds to questions. Alert and orientated to person, time, and action. States he took two muscle relaxers because he was tired and his arm was hurting. States he cannot remember how many beers he drank. Denies any pain or complaints at this time.

## 2016-08-07 ENCOUNTER — Emergency Department (HOSPITAL_COMMUNITY)
Admission: EM | Admit: 2016-08-07 | Discharge: 2016-08-07 | Disposition: A | Discharge status: Home / Self Care | Payer: Self-pay | Attending: Emergency Medicine | Admitting: Emergency Medicine

## 2016-08-07 ENCOUNTER — Encounter (HOSPITAL_COMMUNITY): Payer: Self-pay | Admitting: Emergency Medicine

## 2016-08-07 DIAGNOSIS — Y999 Unspecified external cause status: Secondary | ICD-10-CM | POA: Insufficient documentation

## 2016-08-07 DIAGNOSIS — W260XXA Contact with knife, initial encounter: Secondary | ICD-10-CM | POA: Insufficient documentation

## 2016-08-07 DIAGNOSIS — Y9389 Activity, other specified: Secondary | ICD-10-CM | POA: Insufficient documentation

## 2016-08-07 DIAGNOSIS — F17210 Nicotine dependence, cigarettes, uncomplicated: Secondary | ICD-10-CM | POA: Insufficient documentation

## 2016-08-07 DIAGNOSIS — S51811A Laceration without foreign body of right forearm, initial encounter: Secondary | ICD-10-CM | POA: Insufficient documentation

## 2016-08-07 DIAGNOSIS — Y929 Unspecified place or not applicable: Secondary | ICD-10-CM | POA: Insufficient documentation

## 2016-08-07 DIAGNOSIS — S41111A Laceration without foreign body of right upper arm, initial encounter: Secondary | ICD-10-CM

## 2016-08-07 MED ORDER — LIDOCAINE-EPINEPHRINE (PF) 2 %-1:200000 IJ SOLN
10.0000 mL | Freq: Once | INTRAMUSCULAR | Status: AC
  Administered 2016-08-07: 10 mL
  Filled 2016-08-07: qty 20

## 2016-08-07 MED ORDER — BACITRACIN ZINC 500 UNIT/GM EX OINT
TOPICAL_OINTMENT | CUTANEOUS | Status: AC
  Filled 2016-08-07: qty 0.9

## 2016-08-07 MED ORDER — TRAMADOL HCL 50 MG PO TABS
50.0000 mg | ORAL_TABLET | Freq: Once | ORAL | Status: AC
  Administered 2016-08-07: 50 mg via ORAL
  Filled 2016-08-07: qty 1
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
## 2016-08-07 NOTE — Discharge Instructions (Signed)
Keep your wounds clean using Dial antibacterial soap and water, pat dry. You may also apply a small amount of asthmatic ointment such as Neosporin to wound daily. He may take 600 mg ibuprofen every 6 hours as needed for pain relief. I also recommend resting, elevating and applying ice to right arm for 15-20 minutes 3-4 times daily to help with pain and swelling. Return to the emergency department or be seen by her primary care provider in 7 days for suture removal. Return to the emergency department sooner if symptoms worsen or new onset of fever, redness, swelling, warmth, drainage, decreased range of motion, numbness, tingling, weakness.

## 2016-08-07 NOTE — ED Notes (Addendum)
Administered bacitracin to laceration on right forearm. Covered with tefla and wrapped in sterile gauze. Patient tolerated well.

## 2016-08-07 NOTE — ED Notes (Signed)
Irrigated patient's laceration with 30 ml normal saline. Patient tolerated well.

## 2017-04-20 ENCOUNTER — Emergency Department (HOSPITAL_COMMUNITY): Payer: Self-pay

## 2017-04-20 ENCOUNTER — Encounter (HOSPITAL_COMMUNITY): Payer: Self-pay | Admitting: *Deleted

## 2017-04-20 ENCOUNTER — Emergency Department (HOSPITAL_COMMUNITY)
Admission: EM | Admit: 2017-04-20 | Discharge: 2017-04-20 | Disposition: A | Discharge status: Home / Self Care | Payer: Self-pay | Attending: Emergency Medicine | Admitting: Emergency Medicine

## 2017-04-20 DIAGNOSIS — Y990 Civilian activity done for income or pay: Secondary | ICD-10-CM | POA: Insufficient documentation

## 2017-04-20 DIAGNOSIS — S41012A Laceration without foreign body of left shoulder, initial encounter: Secondary | ICD-10-CM | POA: Insufficient documentation

## 2017-04-20 DIAGNOSIS — W268XXA Contact with other sharp object(s), not elsewhere classified, initial encounter: Secondary | ICD-10-CM | POA: Insufficient documentation

## 2017-04-20 DIAGNOSIS — S41112A Laceration without foreign body of left upper arm, initial encounter: Secondary | ICD-10-CM

## 2017-04-20 DIAGNOSIS — Y9289 Other specified places as the place of occurrence of the external cause: Secondary | ICD-10-CM | POA: Insufficient documentation

## 2017-04-20 DIAGNOSIS — F17210 Nicotine dependence, cigarettes, uncomplicated: Secondary | ICD-10-CM | POA: Insufficient documentation

## 2017-04-20 DIAGNOSIS — Y9389 Activity, other specified: Secondary | ICD-10-CM | POA: Insufficient documentation

## 2017-04-20 MED ORDER — LIDOCAINE-EPINEPHRINE (PF) 1 %-1:200000 IJ SOLN
INTRAMUSCULAR | Status: AC
  Filled 2017-04-20: qty 30

## 2017-04-20 MED ORDER — LIDOCAINE-EPINEPHRINE 2 %-1:100000 IJ SOLN
20.0000 mL | Freq: Once | INTRAMUSCULAR | Status: AC
  Administered 2017-04-20: 20 mL
  Filled 2017-04-20: qty 20

## 2017-04-20 MED ORDER — POVIDONE-IODINE 10 % EX SOLN
CUTANEOUS | Status: AC
Start: 2017-04-20 — End: 2017-04-20
  Administered 2017-04-20: 3
  Filled 2017-04-20: qty 15

## 2017-04-20 MED ORDER — OXYCODONE-ACETAMINOPHEN 5-325 MG PO TABS
1.0000 | ORAL_TABLET | Freq: Once | ORAL | Status: AC
Start: 2017-04-20 — End: 2017-04-20
  Administered 2017-04-20: 1 via ORAL
  Filled 2017-04-20: qty 1

## 2017-04-20 NOTE — ED Triage Notes (Signed)
Pt states he was working yesterday afternoon around 5pm and left shoulder came in contact with a gutter that cut his shoulder; pt still actively bleeding

## 2017-04-20 NOTE — ED Notes (Signed)
Bleeding controlled by pressure dressing at this time.

## 2017-04-20 NOTE — ED Provider Notes (Signed)
Patient care involved wound repair.  LACERATION REPAIR Performed by: Burgess Amor Authorized by: Burgess Amor Consent: Verbal consent obtained. Risks and benefits: risks, benefits and alternatives were discussed Consent given by: patient Patient identity confirmed: provided demographic data Prepped and Draped in normal sterile fashion Wound explored  Laceration Location: left lateral shoulder  Laceration Length: 6cm  No Foreign Bodies seen or palpated  Anesthesia: local infiltration  Local anesthetic: lidocaine 1% with epinephrine  Anesthetic total: 5 ml  Irrigation method: syringe Amount of cleaning: standard  Skin closure: #2 vicryl 4-0 subcutanous simple interrupted, #12 surface sutures using ethilon 4-0, also simple interrupted.  Number of sutures: 12 + 2  Technique: simple interrupted  Patient tolerance: Patient tolerated the procedure well with no immediate complications.    Burgess Amor, PA-C 04/20/17 1610    Lavera Guise, MD 04/20/17 [phone]

## 2017-04-20 NOTE — Discharge Instructions (Signed)
You have 12 surface sutures that will need to be removed in 7 days.  Watch for signs of infection and return without fail for worsening symptoms, including fever, increased redness/swelling, escalating pain, pus drainage or any other symptoms concerning to you.  Ice to keep swelling down. Take ibuprofen and tylenol for pain
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
## 2017-07-25 ENCOUNTER — Emergency Department (HOSPITAL_COMMUNITY): Payer: Self-pay

## 2017-07-25 ENCOUNTER — Encounter (HOSPITAL_COMMUNITY): Payer: Self-pay | Admitting: Cardiology

## 2017-07-25 ENCOUNTER — Emergency Department (HOSPITAL_COMMUNITY)
Admission: EM | Admit: 2017-07-25 | Discharge: 2017-07-25 | Disposition: A | Discharge status: Home / Self Care | Payer: Self-pay | Attending: Emergency Medicine | Admitting: Emergency Medicine

## 2017-07-25 DIAGNOSIS — S61411A Laceration without foreign body of right hand, initial encounter: Secondary | ICD-10-CM | POA: Insufficient documentation

## 2017-07-25 DIAGNOSIS — S52601A Unspecified fracture of lower end of right ulna, initial encounter for closed fracture: Secondary | ICD-10-CM | POA: Insufficient documentation

## 2017-07-25 DIAGNOSIS — W228XXA Striking against or struck by other objects, initial encounter: Secondary | ICD-10-CM | POA: Insufficient documentation

## 2017-07-25 DIAGNOSIS — Y929 Unspecified place or not applicable: Secondary | ICD-10-CM | POA: Insufficient documentation

## 2017-07-25 DIAGNOSIS — F17210 Nicotine dependence, cigarettes, uncomplicated: Secondary | ICD-10-CM | POA: Insufficient documentation

## 2017-07-25 DIAGNOSIS — Y999 Unspecified external cause status: Secondary | ICD-10-CM | POA: Insufficient documentation

## 2017-07-25 DIAGNOSIS — Y939 Activity, unspecified: Secondary | ICD-10-CM | POA: Insufficient documentation

## 2017-07-25 MED ORDER — HYDROCODONE-ACETAMINOPHEN 5-325 MG PO TABS
2.0000 | ORAL_TABLET | Freq: Once | ORAL | Status: AC
  Administered 2017-07-25: 2 via ORAL
  Filled 2017-07-25: qty 2

## 2017-07-25 MED ORDER — IBUPROFEN 800 MG PO TABS
800.0000 mg | ORAL_TABLET | Freq: Once | ORAL | Status: AC
  Administered 2017-07-25: 800 mg via ORAL
  Filled 2017-07-25: qty 1

## 2017-07-25 MED ORDER — HYDROCODONE-ACETAMINOPHEN 5-325 MG PO TABS: 1.0000 | ORAL_TABLET | ORAL | 0 refills | Status: DC | PRN
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
## 2017-07-25 NOTE — Discharge Instructions (Signed)
Tylenol and motrin for pain. Splint and no weight bearing until cleared by ortho.

## 2017-08-01 ENCOUNTER — Encounter: Payer: Self-pay | Admitting: Orthopedic Surgery

## 2017-08-01 ENCOUNTER — Ambulatory Visit (INDEPENDENT_AMBULATORY_CARE_PROVIDER_SITE_OTHER): Payer: Self-pay

## 2017-08-01 ENCOUNTER — Ambulatory Visit: Payer: Self-pay | Admitting: Orthopedic Surgery

## 2017-08-01 VITALS — BP 174/110 | HR 68 | Ht 71.0 in | Wt 184.0 lb

## 2017-08-01 DIAGNOSIS — S5290XA Unspecified fracture of unspecified forearm, initial encounter for closed fracture: Secondary | ICD-10-CM

## 2017-08-01 MED ORDER — HYDROCODONE-ACETAMINOPHEN 5-325 MG PO TABS: 1.0000 | ORAL_TABLET | Freq: Four times a day (QID) | ORAL | 0 refills | Status: DC | PRN
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
## 2017-08-01 NOTE — Patient Instructions (Signed)
No work x 8 weeks

## 2017-08-15 ENCOUNTER — Encounter (HOSPITAL_COMMUNITY): Payer: Self-pay

## 2017-08-15 ENCOUNTER — Emergency Department (HOSPITAL_COMMUNITY)
Admission: EM | Admit: 2017-08-15 | Discharge: 2017-08-15 | Disposition: A | Discharge status: Home / Self Care | Payer: Self-pay | Attending: Emergency Medicine | Admitting: Emergency Medicine

## 2017-08-15 ENCOUNTER — Emergency Department (HOSPITAL_COMMUNITY): Payer: Self-pay

## 2017-08-15 DIAGNOSIS — S52691A Other fracture of lower end of right ulna, initial encounter for closed fracture: Secondary | ICD-10-CM | POA: Insufficient documentation

## 2017-08-15 DIAGNOSIS — Y999 Unspecified external cause status: Secondary | ICD-10-CM | POA: Insufficient documentation

## 2017-08-15 DIAGNOSIS — Y929 Unspecified place or not applicable: Secondary | ICD-10-CM | POA: Insufficient documentation

## 2017-08-15 DIAGNOSIS — X500XXA Overexertion from strenuous movement or load, initial encounter: Secondary | ICD-10-CM | POA: Insufficient documentation

## 2017-08-15 DIAGNOSIS — Y9389 Activity, other specified: Secondary | ICD-10-CM | POA: Insufficient documentation

## 2017-08-15 DIAGNOSIS — F17210 Nicotine dependence, cigarettes, uncomplicated: Secondary | ICD-10-CM | POA: Insufficient documentation

## 2017-08-15 MED ORDER — HYDROCODONE-ACETAMINOPHEN 5-325 MG PO TABS
1.0000 | ORAL_TABLET | Freq: Once | ORAL | Status: AC
  Administered 2017-08-15: 1 via ORAL
  Filled 2017-08-15: qty 1

## 2017-08-15 NOTE — ED Notes (Signed)
CC in to assess 

## 2017-08-15 NOTE — Discharge Instructions (Signed)
You need to call the orthopedist today to schedule a follow-up appointment.  May rotate Tylenol and ibuprofen for your pain.  If you are requiring more pain medication you need to follow-up with your orthopedist about this.  Please please return to the ER if you have any new or worsening symptoms.
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
## 2017-08-15 NOTE — ED Notes (Signed)
Awaiting reeval and DC 

## 2017-08-15 NOTE — ED Notes (Addendum)
Pt reports that he cut his cast off 3 days ago  That he does not recall his ortho name because a friend has made all of his appointments for him- but office is close to this ED  Pt moving fingers, sensation is intact- original fx 07/25/17
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
## 2017-08-27 ENCOUNTER — Telehealth: Payer: Self-pay | Admitting: Orthopedic Surgery

## 2017-08-27 NOTE — Telephone Encounter (Signed)
What is he asking for?  He needs to come in next available is fine. Is this what you needed?  If he took off his own cast, not much I can do now.
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
## 2017-08-27 NOTE — Telephone Encounter (Signed)
Spoke with patient, offered next available, Monday, 09/01/17 which patient cannot do, due to "a previous engagement;" scheduled for next available after that.  States he is in a temporary cast, and said staying out of work.

## 2017-09-08 DIAGNOSIS — S5291XA Unspecified fracture of right forearm, initial encounter for closed fracture: Secondary | ICD-10-CM | POA: Insufficient documentation

## 2017-09-10 ENCOUNTER — Ambulatory Visit (INDEPENDENT_AMBULATORY_CARE_PROVIDER_SITE_OTHER): Payer: Self-pay

## 2017-09-10 ENCOUNTER — Ambulatory Visit (INDEPENDENT_AMBULATORY_CARE_PROVIDER_SITE_OTHER): Payer: Self-pay | Admitting: Orthopedic Surgery

## 2017-09-10 ENCOUNTER — Encounter: Payer: Self-pay | Admitting: Orthopedic Surgery

## 2017-09-10 DIAGNOSIS — S5291XD Unspecified fracture of right forearm, subsequent encounter for closed fracture with routine healing: Secondary | ICD-10-CM

## 2017-09-10 DIAGNOSIS — S5290XA Unspecified fracture of unspecified forearm, initial encounter for closed fracture: Secondary | ICD-10-CM

## 2017-09-10 MED ORDER — HYDROCODONE-ACETAMINOPHEN 5-325 MG PO TABS: 1.0000 | ORAL_TABLET | Freq: Four times a day (QID) | ORAL | 0 refills | Status: DC | PRN

## 2017-09-10 NOTE — Addendum Note (Signed)
Addended by: Fuller CanadaHARRISON, Latarra Eagleton E on: 09/10/2017 12:01 PM   Modules accepted: Orders
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
## 2017-09-26 ENCOUNTER — Ambulatory Visit: Payer: Self-pay | Admitting: Orthopedic Surgery

## 2021-03-27 ENCOUNTER — Emergency Department (HOSPITAL_COMMUNITY): Payer: Medicaid Other

## 2021-03-27 ENCOUNTER — Emergency Department (HOSPITAL_COMMUNITY)
Admission: EM | Admit: 2021-03-27 | Discharge: 2021-03-27 | Disposition: A | Discharge status: Home / Self Care | Payer: Medicaid Other | Attending: Emergency Medicine | Admitting: Emergency Medicine

## 2021-03-27 ENCOUNTER — Encounter (HOSPITAL_COMMUNITY): Payer: Self-pay | Admitting: Emergency Medicine

## 2021-03-27 ENCOUNTER — Other Ambulatory Visit: Payer: Self-pay

## 2021-03-27 DIAGNOSIS — J3489 Other specified disorders of nose and nasal sinuses: Secondary | ICD-10-CM | POA: Insufficient documentation

## 2021-03-27 DIAGNOSIS — J029 Acute pharyngitis, unspecified: Secondary | ICD-10-CM | POA: Insufficient documentation

## 2021-03-27 DIAGNOSIS — R0602 Shortness of breath: Secondary | ICD-10-CM | POA: Insufficient documentation

## 2021-03-27 DIAGNOSIS — Z5321 Procedure and treatment not carried out due to patient leaving prior to being seen by health care provider: Secondary | ICD-10-CM | POA: Insufficient documentation

## 2021-03-27 DIAGNOSIS — R0981 Nasal congestion: Secondary | ICD-10-CM | POA: Insufficient documentation

## 2021-03-27 DIAGNOSIS — Z20822 Contact with and (suspected) exposure to covid-19: Secondary | ICD-10-CM | POA: Insufficient documentation

## 2021-03-27 DIAGNOSIS — R059 Cough, unspecified: Secondary | ICD-10-CM | POA: Insufficient documentation

## 2021-03-27 LAB — RESP PANEL BY RT-PCR (FLU A&B, COVID) ARPGX2
Influenza A by PCR: NEGATIVE
Influenza B by PCR: NEGATIVE
SARS Coronavirus 2 by RT PCR: NEGATIVE

## 2021-03-27 NOTE — ED Notes (Signed)
Pt decided to leave 

## 2021-03-27 NOTE — ED Triage Notes (Signed)
Pt states he started feeling SOB yesterday along with having a sore throat and runny nose. Pt states it is hard for him to take a deep breath.

## 2021-03-27 NOTE — ED Provider Notes (Signed)
Emergency Medicine Provider Triage Evaluation Note  Grant Cooper , a 35 y.o. male  was evaluated in triage.  Pt complains of uri sxs.  Review of Systems  Positive: Rhinorrhea, congestion, cough, sob Negative: fever  Physical Exam  BP (!) 157/85 (BP Location: Left Arm)   Pulse 69   Temp 99 F (37.2 C) (Oral)   Resp 18   SpO2 97%  Gen:   Awake, no distress   Resp:  Normal effort  MSK:   Moves extremities without difficulty  Other:  Dry cough  Medical Decision Making  Medically screening exam initiated at 12:40 PM.  Appropriate orders placed.  [name] was informed that the remainder of the evaluation will be completed by another provider, this initial triage assessment does not replace that evaluation, and the importance of remaining in the ED until their evaluation is complete.    Rayne Du 03/27/21 1241    Terrilee Files, MD 03/27/21 [phone]

## 2021-04-30 ENCOUNTER — Emergency Department (HOSPITAL_COMMUNITY)
Admission: EM | Admit: 2021-04-30 | Discharge: 2021-04-30 | Disposition: A | Discharge status: Home / Self Care | Payer: Medicaid Other | Attending: Emergency Medicine | Admitting: Emergency Medicine

## 2021-04-30 ENCOUNTER — Emergency Department (HOSPITAL_COMMUNITY): Payer: Medicaid Other

## 2021-04-30 DIAGNOSIS — K0889 Other specified disorders of teeth and supporting structures: Secondary | ICD-10-CM | POA: Insufficient documentation

## 2021-04-30 DIAGNOSIS — Y9389 Activity, other specified: Secondary | ICD-10-CM | POA: Insufficient documentation

## 2021-04-30 DIAGNOSIS — Z79899 Other long term (current) drug therapy: Secondary | ICD-10-CM | POA: Insufficient documentation

## 2021-04-30 DIAGNOSIS — F17210 Nicotine dependence, cigarettes, uncomplicated: Secondary | ICD-10-CM | POA: Insufficient documentation

## 2021-04-30 DIAGNOSIS — M7022 Olecranon bursitis, left elbow: Secondary | ICD-10-CM | POA: Insufficient documentation

## 2021-04-30 LAB — SYNOVIAL CELL COUNT + DIFF, W/ CRYSTALS
Crystals, Fluid: NONE SEEN
Eosinophils-Synovial: 1 % (ref 0–1)
Lymphocytes-Synovial Fld: 17 % (ref 0–20)
Monocyte-Macrophage-Synovial Fluid: 25 % — ABNORMAL LOW (ref 50–90)
Neutrophil, Synovial: 57 % — ABNORMAL HIGH (ref 0–25)
WBC, Synovial: 502 /mm3 — ABNORMAL HIGH (ref 0–200)

## 2021-04-30 MED ORDER — ACETAMINOPHEN 325 MG PO TABS
650.0000 mg | ORAL_TABLET | Freq: Once | ORAL | Status: AC
  Administered 2021-04-30: 650 mg via ORAL
  Filled 2021-04-30: qty 2

## 2021-04-30 MED ORDER — BACITRACIN ZINC 500 UNIT/GM EX OINT: TOPICAL_OINTMENT | Freq: Two times a day (BID) | CUTANEOUS | Status: DC

## 2021-04-30 MED ORDER — HYDROCODONE-ACETAMINOPHEN 5-325 MG PO TABS: 1.0000 | ORAL_TABLET | Freq: Four times a day (QID) | ORAL | 0 refills | Status: AC | PRN

## 2021-04-30 MED ORDER — IBUPROFEN 800 MG PO TABS
800.0000 mg | ORAL_TABLET | Freq: Once | ORAL | Status: AC
  Administered 2021-04-30: 800 mg via ORAL
  Filled 2021-04-30: qty 2

## 2021-04-30 MED ORDER — LIDOCAINE-EPINEPHRINE (PF) 2 %-1:200000 IJ SOLN
10.0000 mL | Freq: Once | INTRAMUSCULAR | Status: AC
  Administered 2021-04-30: 10 mL
  Filled 2021-04-30: qty 20

## 2021-04-30 MED ORDER — PENICILLIN V POTASSIUM 500 MG PO TABS: 500.0000 mg | ORAL_TABLET | Freq: Four times a day (QID) | ORAL | 0 refills | Status: AC
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
## 2021-04-30 NOTE — ED Notes (Signed)
Pt verbalizes understanding of discharge instructions. Opportunity for questions and answers were provided. Pt discharged from the ED.   ?

## 2021-04-30 NOTE — Discharge Instructions (Addendum)
Please use Tylenol or ibuprofen for pain.  You may use 600 mg ibuprofen every 6 hours or 1000 mg of Tylenol every 6 hours.  You may choose to alternate between the 2.  This would be most effective.  Not to exceed 4 g of Tylenol within 24 hours.  Not to exceed 3200 mg ibuprofen 24 hours.  You can use the Norco for breakthrough pain. Please take the entire course of antibiotics as prescribed. I will call you to further discuss the results of the fluid we took out of your elbow. Please follow up if you begin to notice signs of infection over the site drained your elbow, including worsening redness, heat, purulent drainage.  Please follow-up with a dentist at your earliest convenience for further evaluation of your tooth pain.
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
## 2021-04-30 NOTE — ED Triage Notes (Signed)
Pt. Stated, I have a abscess on my left elbow and I have a bad tooth for a week or over.

## 2021-05-01 LAB — GLUCOSE, BODY FLUID OTHER: Glucose, Body Fluid Other: 43 mg/dL

## 2021-05-03 LAB — BODY FLUID CULTURE W GRAM STAIN: Culture: NO GROWTH

## 2022-01-21 IMAGING — DX DG ELBOW COMPLETE 3+V*L*
4 series · 4 of 4 positions shown · non-contrast
Comparison: None.

CLINICAL DATA: Swollen mass, hematoma of the left elbow

EXAM:
LEFT ELBOW - COMPLETE 3+ VIEW

[elbow ap]
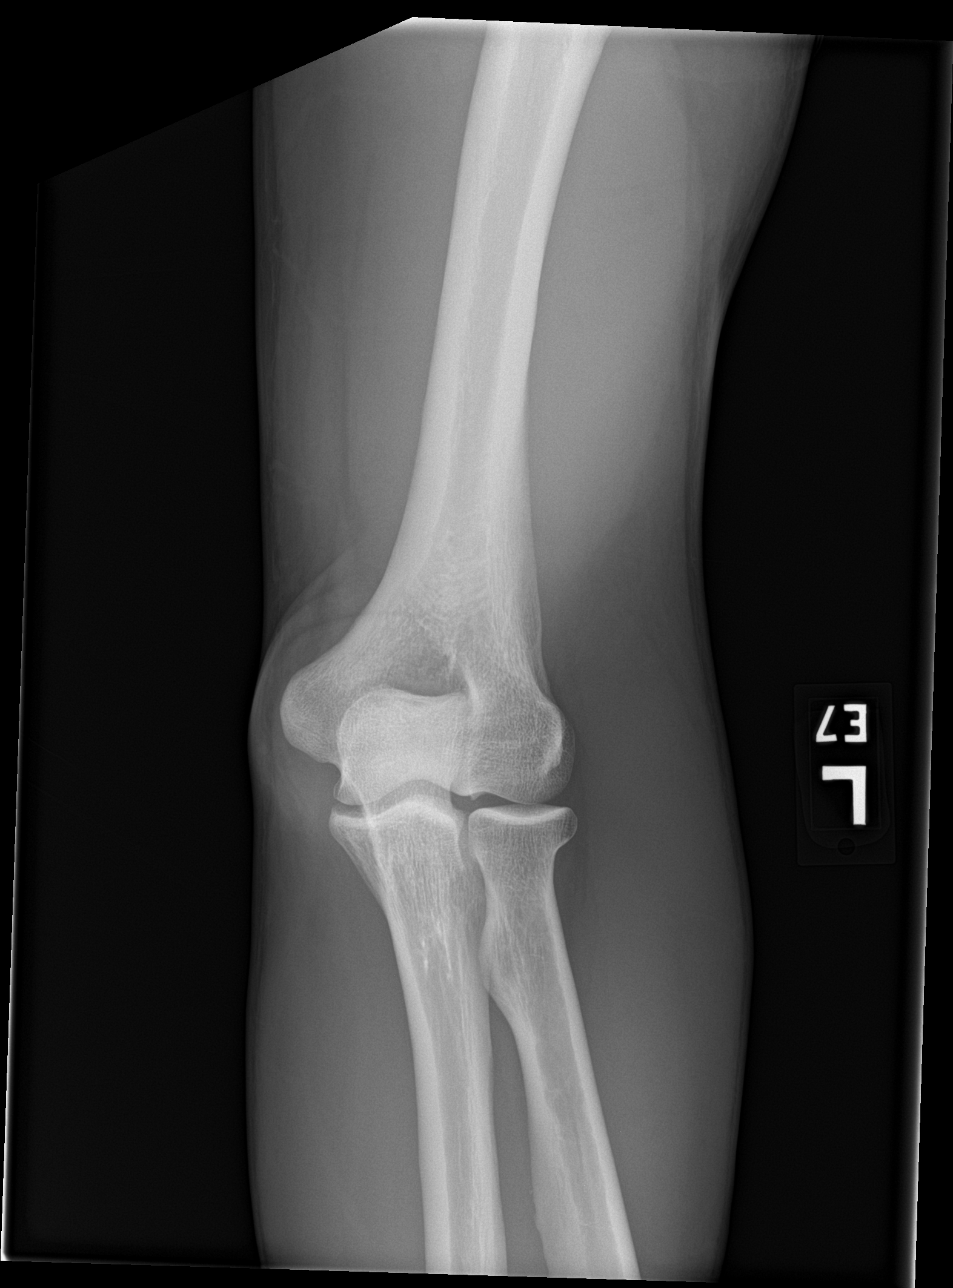

[elbow obl (1 of 2)]
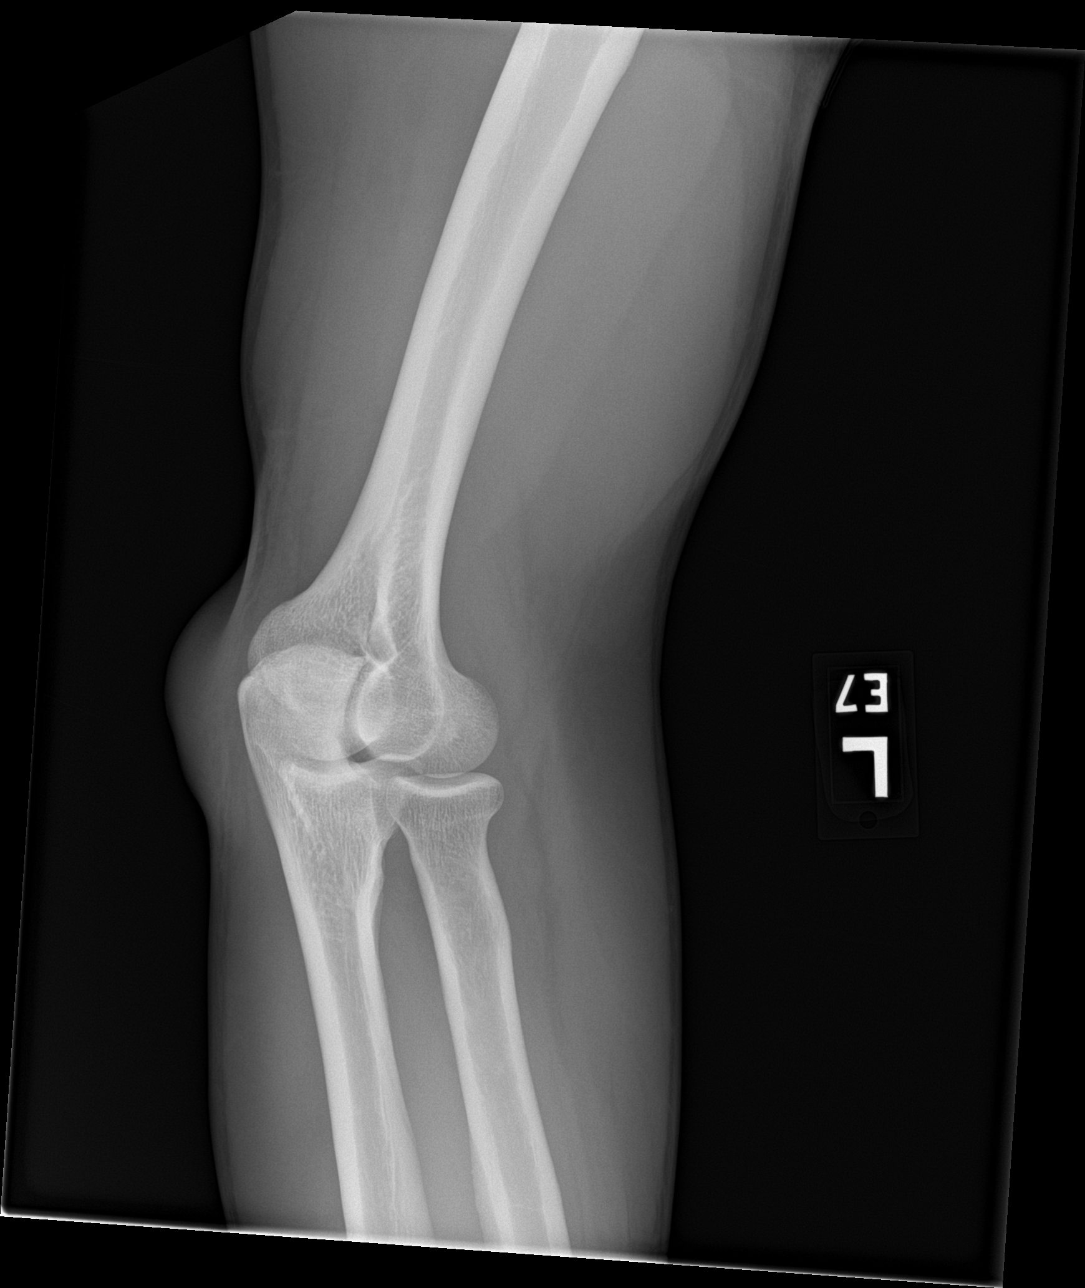

[elbow obl (2 of 2)]
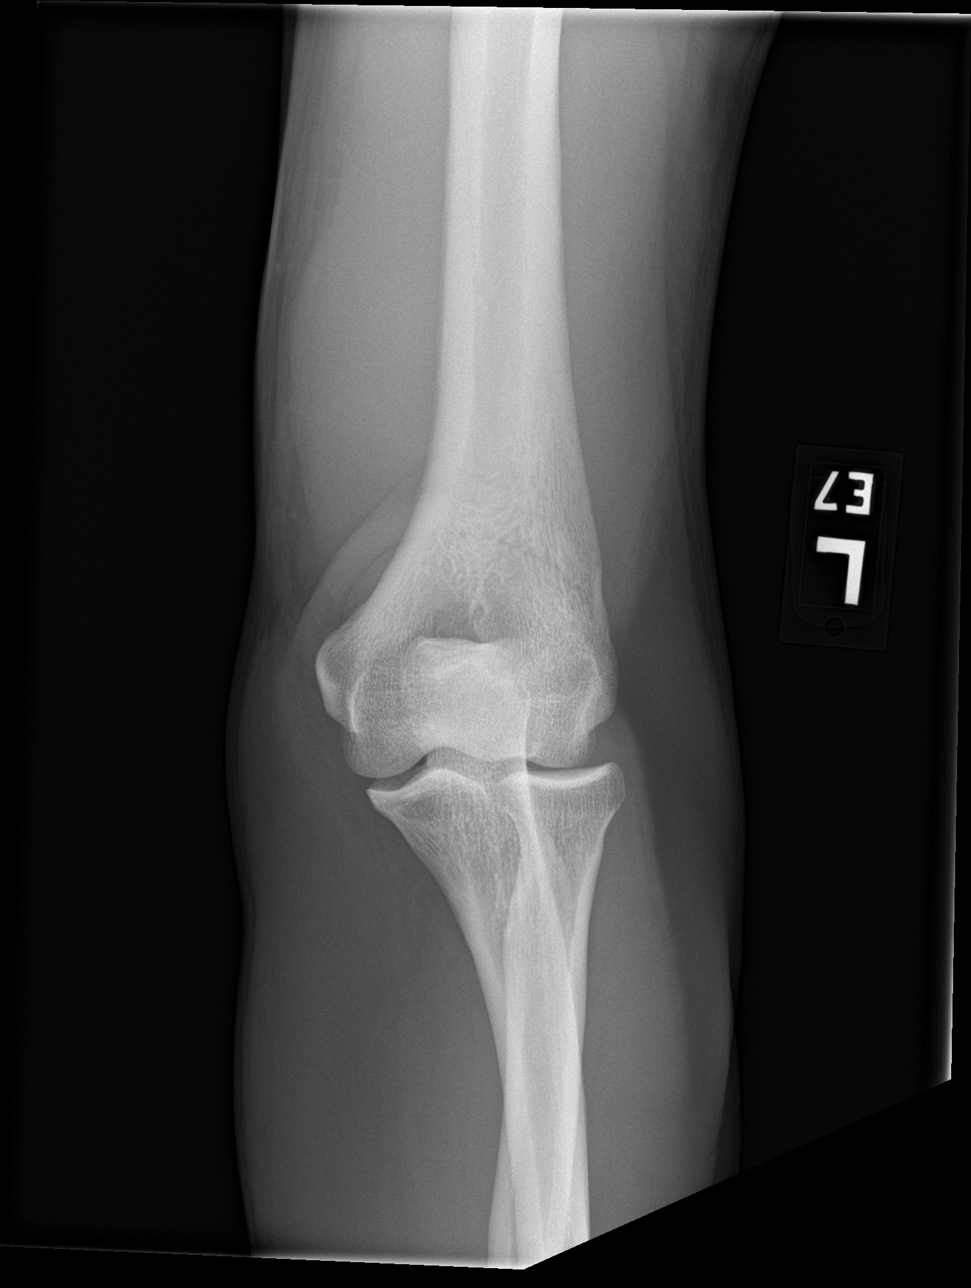

[elbow lat]
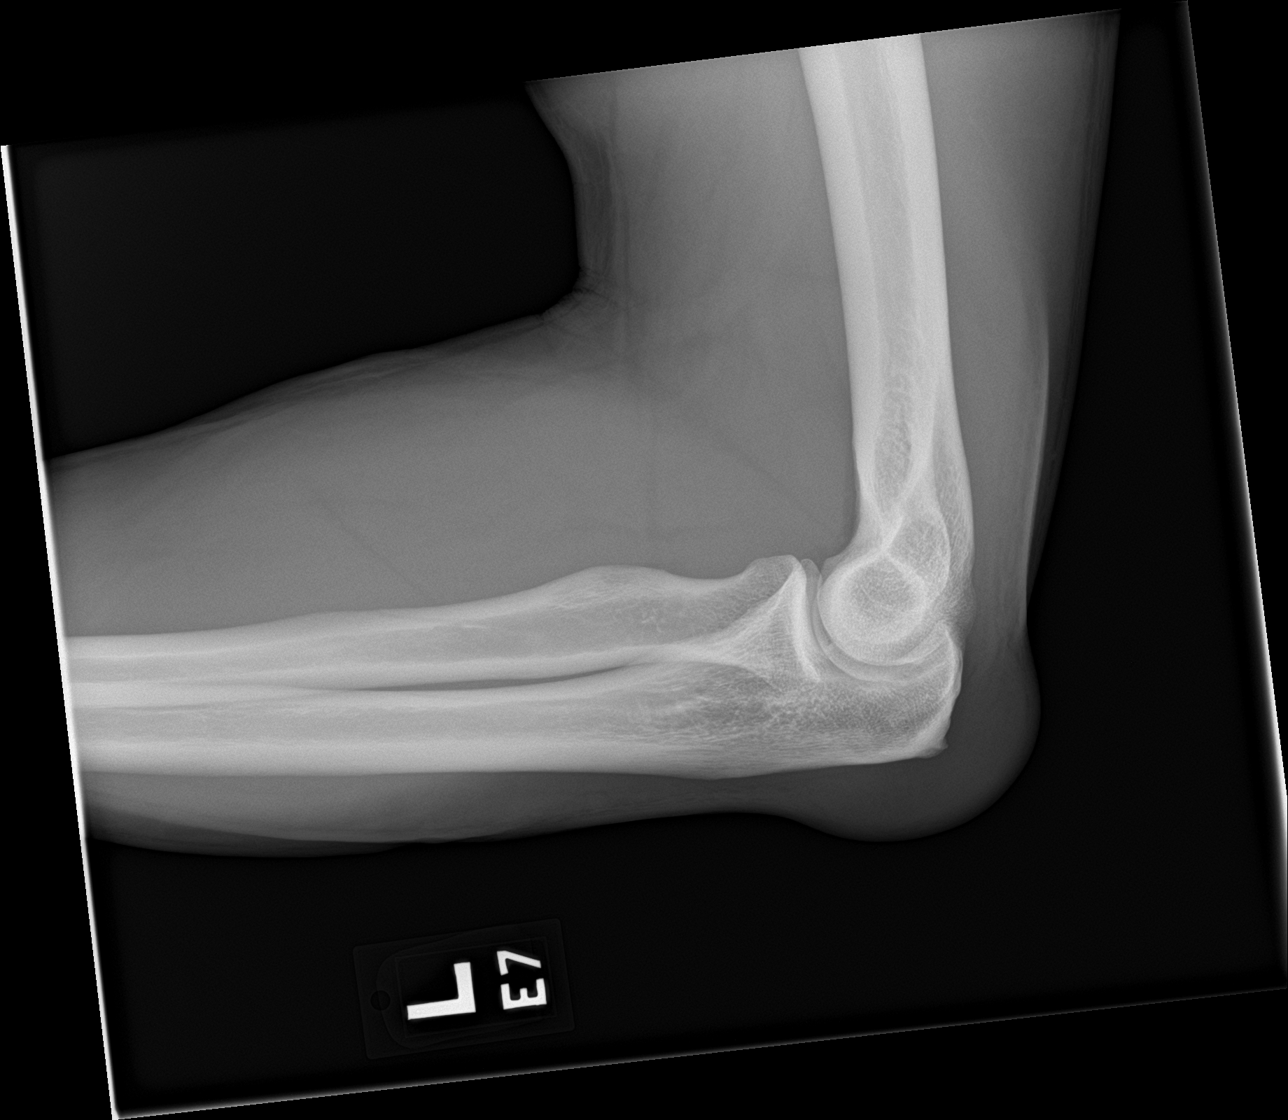

[4 of 4 positions shown; findings below may reference images not displayed]

FINDINGS: No fracture or dislocation of the left elbow. No elbow joint
effusion. Joint spaces are preserved. Severe soft tissue thickening
over the olecranon.
IMPRESSION: 1. No fracture or dislocation of the left elbow. No elbow joint
effusion.

2. Severe soft tissue thickening over the olecranon, which may
reflect soft tissue edema or bursal fluid.
